# Patient Record
Sex: Male | Born: 1965 | Race: Black or African American | Hispanic: No | Marital: Married | Smoking: Never smoker
Health system: Southern US, Community
[De-identification: ages and names within clinical notes are randomized; demographics above are authoritative.]

## PROBLEM LIST (undated history)

## (undated) DIAGNOSIS — I10 Essential (primary) hypertension: Secondary | ICD-10-CM

---

## 2018-01-26 HISTORY — PX: SPINAL FUSION: SHX223

## 2020-01-15 ENCOUNTER — Emergency Department (HOSPITAL_BASED_OUTPATIENT_CLINIC_OR_DEPARTMENT_OTHER): Payer: BLUE CROSS/BLUE SHIELD

## 2020-01-15 ENCOUNTER — Other Ambulatory Visit: Payer: Self-pay

## 2020-01-15 ENCOUNTER — Encounter (HOSPITAL_BASED_OUTPATIENT_CLINIC_OR_DEPARTMENT_OTHER): Payer: Self-pay

## 2020-01-15 ENCOUNTER — Emergency Department (HOSPITAL_BASED_OUTPATIENT_CLINIC_OR_DEPARTMENT_OTHER)
Admission: EM | Admit: 2020-01-15 | Discharge: 2020-01-15 | Disposition: A | Discharge status: Home / Self Care | Payer: BLUE CROSS/BLUE SHIELD | Attending: Emergency Medicine | Admitting: Emergency Medicine

## 2020-01-15 DIAGNOSIS — Z20822 Contact with and (suspected) exposure to covid-19: Secondary | ICD-10-CM | POA: Diagnosis not present

## 2020-01-15 DIAGNOSIS — K529 Noninfective gastroenteritis and colitis, unspecified: Secondary | ICD-10-CM

## 2020-01-15 DIAGNOSIS — K4090 Unilateral inguinal hernia, without obstruction or gangrene, not specified as recurrent: Secondary | ICD-10-CM | POA: Insufficient documentation

## 2020-01-15 DIAGNOSIS — R1033 Periumbilical pain: Secondary | ICD-10-CM | POA: Diagnosis present

## 2020-01-15 DIAGNOSIS — I10 Essential (primary) hypertension: Secondary | ICD-10-CM | POA: Insufficient documentation

## 2020-01-15 HISTORY — DX: Essential (primary) hypertension: I10

## 2020-01-15 LAB — COMPREHENSIVE METABOLIC PANEL
ALT: 26 U/L (ref 0–44)
AST: 20 U/L (ref 15–41)
Albumin: 4.1 g/dL (ref 3.5–5.0)
Alkaline Phosphatase: 76 U/L (ref 38–126)
Anion gap: 9 (ref 5–15)
BUN: 14 mg/dL (ref 6–20)
CO2: 28 mmol/L (ref 22–32)
Calcium: 8.8 mg/dL — ABNORMAL LOW (ref 8.9–10.3)
Chloride: 100 mmol/L (ref 98–111)
Creatinine, Ser: 1.23 mg/dL (ref 0.61–1.24)
GFR, Estimated: >60 mL/min (ref >60)
Glucose, Bld: 89 mg/dL (ref 70–99)
Potassium: 3.6 mmol/L (ref 3.5–5.1)
Sodium: 137 mmol/L (ref 135–145)
Total Bilirubin: 0.8 mg/dL (ref 0.3–1.2)
Total Protein: 7.1 g/dL (ref 6.5–8.1)

## 2020-01-15 LAB — CBC
HCT: 46.5 % (ref 39.0–52.0)
Hemoglobin: 16.1 g/dL (ref 13.0–17.0)
MCH: 29.2 pg (ref 26.0–34.0)
MCHC: 34.6 g/dL (ref 30.0–36.0)
MCV: 84.2 fL (ref 80.0–100.0)
Platelets: 195 10*3/uL (ref 150–400)
RBC: 5.52 MIL/uL (ref 4.22–5.81)
RDW: 12.8 % (ref 11.5–15.5)
WBC: 4.5 10*3/uL (ref 4.0–10.5)
nRBC: 0 % (ref 0.0–0.2)

## 2020-01-15 LAB — URINALYSIS, ROUTINE W REFLEX MICROSCOPIC
Bilirubin Urine: NEGATIVE
Glucose, UA: NEGATIVE mg/dL
Hgb urine dipstick: NEGATIVE
Ketones, ur: 15 mg/dL — AB
Leukocytes,Ua: NEGATIVE
Nitrite: NEGATIVE
Protein, ur: NEGATIVE mg/dL
Specific Gravity, Urine: >1.03 — ABNORMAL HIGH (ref 1.005–1.030)
pH: 6 (ref 5.0–8.0)

## 2020-01-15 LAB — LIPASE, BLOOD: Lipase: 17 U/L (ref 11–51)

## 2020-01-15 LAB — RESP PANEL BY RT-PCR (FLU A&B, COVID) ARPGX2
Influenza A by PCR: NEGATIVE
Influenza B by PCR: NEGATIVE
SARS Coronavirus 2 by RT PCR: NEGATIVE

## 2020-01-15 MED ORDER — SODIUM CHLORIDE 0.9 % IV BOLUS
1000.0000 mL | Freq: Once | INTRAVENOUS | Status: AC
  Administered 2020-01-15: 13:00:00 1000 mL via INTRAVENOUS

## 2020-01-15 MED ORDER — ONDANSETRON 4 MG PO TBDP: 4.0000 mg | ORAL_TABLET | Freq: Three times a day (TID) | ORAL | 0 refills | Status: AC | PRN

## 2020-01-15 MED ORDER — ONDANSETRON HCL 4 MG/2ML IJ SOLN
4.0000 mg | Freq: Once | INTRAMUSCULAR | Status: AC
  Administered 2020-01-15: 13:00:00 4 mg via INTRAVENOUS
  Filled 2020-01-15: qty 2

## 2020-01-15 MED ORDER — IOHEXOL 300 MG/ML  SOLN
100.0000 mL | Freq: Once | INTRAMUSCULAR | Status: AC | PRN
  Administered 2020-01-15: 14:00:00 100 mL via INTRAVENOUS

## 2020-01-15 MED ORDER — DICYCLOMINE HCL 20 MG PO TABS: 20.0000 mg | ORAL_TABLET | Freq: Two times a day (BID) | ORAL | 0 refills | Status: AC

## 2020-01-15 NOTE — ED Notes (Signed)
[facility] w/o any complaints, gait remained very steady. Skin warm and dry

## 2020-01-15 NOTE — ED Notes (Signed)
States since last Friday has had umbilical pain, diarrhea, some nausea, PO intake is poor, states after eating pain increases.

## 2020-01-15 NOTE — Discharge Instructions (Signed)
Your CT scan showed findings consistent with inflammation in your small intestine likely from a viral GI bug. Please pick up medication and take as prescribed to help with nausea and abdominal cramping. Continue drinking plenty of fluids to stay hydrated.   Your CT scan also showed an incidental finding of a fat containing hernia in your right scrotum. Please follow up with [facility] to discuss elective repair.   Follow up with your PCP regarding your ED visit today and for recheck of your symptoms.   Return to the ED for any worsening symptoms.

## 2020-01-15 NOTE — ED Notes (Signed)
Pt states he is feeling much better already after IV Zofran and rec IV fluids

## 2020-01-15 NOTE — ED Triage Notes (Signed)
Friday started feeling nauseous and having abdominal cramping and dizziness. Saturday started having chills and night sweats & diarrhea. Complaining of mid abdominal pain, fatigue. Been vaccinated for covid & flu.

## 2020-01-15 NOTE — ED Provider Notes (Signed)
[facility] EMERGENCY DEPARTMENT Provider Note   CSN: [id number] Arrival date & time: 01/15/20  1043     History Chief Complaint  Patient presents with  . Diarrhea    Ilyas Lipsitz is a 54 y.o. male with PMHx HTN who presents to the ED today with complaint of gradual onset, constant, achy, periumbilical abdominal pain x 4 days. Pt also complains of nausea, lightheaded/dizziness, chills, and subjective fevers as well as watery diarrhea. He denies any recent sick contacts however works for [employer] and is never sure if he has been around anyone who is sick. He is vaccinated against COVID and flu. He has been drinking gingerale and taking his regular acid reflux medication without relief. Denies any suspicious food intake - eats at [facility] every morning and had an egg and sausage mcmuffin with 2 strawberry muffins prior to his symptoms beginning. No recent abx use. No other complaints at this time.   The history is provided by the patient and medical records.       Past Medical History:  Diagnosis Date  . Hypertension     There are no problems to display for this patient.   History reviewed. No pertinent surgical history.     History reviewed. No pertinent family history.  Social History   Tobacco Use  . Smoking status: Never Smoker  Substance Use Topics  . Alcohol use: Not Currently    Home Medications Prior to Admission medications   Medication Sig Start Date End Date Taking? Authorizing Provider  dicyclomine (BENTYL) 20 MG tablet Take 1 tablet (20 mg total) by mouth 2 (two) times daily. 01/15/20   Hyman Hopes, Donterrius Santucci, PA-C  ondansetron (ZOFRAN ODT) 4 MG disintegrating tablet Take 1 tablet (4 mg total) by mouth every 8 (eight) hours as needed for nausea or vomiting. 01/15/20   Tanda Rockers, PA-C    Allergies    Patient has no known allergies.  Review of Systems   Review of Systems  Constitutional: Positive for chills, fatigue and fever.  Respiratory:  Negative for cough and shortness of breath.   Cardiovascular: Negative for chest pain.  Gastrointestinal: Positive for abdominal pain, diarrhea and nausea. Negative for vomiting.  Neurological: Positive for dizziness and light-headedness.  All other systems reviewed and are negative.   Physical Exam Updated Vital Signs BP 120/80 (BP Location: Right Arm)   Pulse 76   Temp 98.4 F (36.9 C)   Resp 18   Ht 5' (1.524 m)   Wt 114.4 kg   SpO2 99%   BMI 49.27 kg/m   Physical Exam Vitals and nursing note reviewed.  Constitutional:      Appearance: He is obese. He is not ill-appearing or diaphoretic.  HENT:     Head: Normocephalic and atraumatic.  Eyes:     Conjunctiva/sclera: Conjunctivae normal.  Cardiovascular:     Rate and Rhythm: Normal rate and regular rhythm.     Pulses: Normal pulses.  Pulmonary:     Effort: Pulmonary effort is normal.     Breath sounds: No wheezing, rhonchi or rales.  Abdominal:     Palpations: Abdomen is soft.     Tenderness: There is abdominal tenderness in the epigastric area and periumbilical area. There is no right CVA tenderness, left CVA tenderness, guarding or rebound.  Musculoskeletal:     Cervical back: Neck supple.  Skin:    General: Skin is warm and dry.  Neurological:     Mental Status: He is alert.  ED Results / Procedures / Treatments   Labs (all labs ordered are listed, but only abnormal results are displayed) Labs Reviewed  COMPREHENSIVE METABOLIC PANEL - Abnormal; Notable for the following components:      Result Value   Calcium 8.8 (*)    All other components within normal limits  URINALYSIS, ROUTINE W REFLEX MICROSCOPIC - Abnormal; Notable for the following components:   Specific Gravity, Urine >1.030 (*)    Ketones, ur 15 (*)    All other components within normal limits  RESP PANEL BY RT-PCR (FLU A&B, COVID) ARPGX2  LIPASE, BLOOD  CBC    EKG None  Radiology CT Abdomen Pelvis W Contrast  Result Date:  01/15/2020 CLINICAL DATA:  Abdominal pain and nausea. EXAM: CT ABDOMEN AND PELVIS WITH CONTRAST TECHNIQUE: Multidetector CT imaging of the abdomen and pelvis was performed using the standard protocol following bolus administration of intravenous contrast. CONTRAST:  100mL OMNIPAQUE IOHEXOL 300 MG/ML  SOLN COMPARISON:  November 2020 FINDINGS: Lower chest: Lung bases are clear. Hepatobiliary: No focal liver lesions are appreciable. The gallbladder wall is not appreciably thickened. There is no biliary dilatation. Pancreas: No pancreatic mass or inflammatory focus. Spleen: No splenic lesions are evident. Adrenals/Urinary Tract: Adrenals bilaterally appear normal. Kidneys bilaterally show no evident mass or hydronephrosis on either side. There is no appreciable renal or ureteral calculus on either side. Urinary bladder is midline with urinary bladder wall thickness normal. Stomach/Bowel: There is wall thickening involving multiple loops of small bowel, primarily involving loops of distal jejunum in much of the ileum. Note that the terminal ileum appears normal. There is no appreciable colonic wall thickening. No evident gastric wall thickening. No evident bowel obstruction. No evident free air or portal venous air. Vascular/Lymphatic: There is no abdominal aortic aneurysm. No arterial vascular lesions are evident. Major venous structures appear patent. There is no evident adenopathy in the abdomen or pelvis. Reproductive: The prostate and seminal vesicles appear normal in size and contour. Other: No appendiceal region inflammation. Appendix rather diminutive. There is slight ascites in the lower right abdomen adjacent to loops of small bowel with mildly thickened walls. No appreciable ascites elsewhere. No abscess in the abdomen or pelvis. Fat containing inguinal hernia on the right extends to the level of the right scrotum. No bowel containing hernia evident. Musculoskeletal: Postoperative changes noted in the lower  lumbar region. There is spinal stenosis at L3-4 due to bony hypertrophy and disc protrusion. Similar changes noted to a lesser extent at L2-3. No blastic or lytic bone lesions are evident. There is arthropathy in the sacroiliac joints with partial fusion bilaterally in the sacroiliac joints. No erosion evident. No intramuscular lesions are evident. IMPRESSION: 1. Loops of dilated bowel involving distal jejunum in much of the ileum, likely due to a degree of enteritis. Terminal ileum appears normal. No fistula. No bowel obstruction. Appendix region appears unremarkable. No abscess in the abdomen pelvis. 2.  Trace ascites right lower abdomen. 3. Fat containing inguinal hernia extending into the right scrotum. No bowel containing hernia. 4. Spinal stenosis at L2-3 and L3-4 due to disc protrusion and bony hypertrophy. Postoperative changes noted in lower lumbar region. Electronically Signed   By: Bretta BangWilliam  Woodruff III M.D.   On: 01/15/2020 14:16    Procedures Procedures (including critical care time)  Medications Ordered in ED Medications  sodium chloride 0.9 % bolus 1,000 mL (0 mLs Intravenous Stopped 01/15/20 1447)  ondansetron (ZOFRAN) injection 4 mg (4 mg Intravenous Given 01/15/20 1314)  iohexol (  OMNIPAQUE) 300 MG/ML solution 100 mL (100 mLs Intravenous Contrast Given 01/15/20 1405)    ED Course  I have reviewed the triage vital signs and the nursing notes.  Pertinent labs & imaging results that were available during my care of the patient were reviewed by me and considered in my medical decision making (see chart for details).    MDM Rules/Calculators/A&P                          54 year old male who presents to the ED today with complaint of periumbilical abdominal pain, nausea, diarrhea for the past several days with a dizziness/lightheadedness feeling.  Denies any recent sick contacts.  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  Denies any recent issues food intake.  No antibiotic use to suggest C. Difficile.  Arrival to the ED patient is afebrile, nontachycardic nontachypneic.  He appears to be in no acute distress.  He has tenderness palpation along his periumbilical and epigastric area at this time.  No previous surgical history to abdomen.  He has no obvious right lower quadrant tenderness palpation however he is somewhat diffusely tender and is difficult to assess.  Given periumbilical abdominal pain will plan for CT scan at this time to assess for any appendicitis or other abnormality.  We will plan for fluids, orthostatic vital signs, Zofran for nausea.   Patient had lab work done while in the waiting room.   CBC without leukocytosis and hemoglobin stable at 16.1.  CMP without electrolyte abnormalities.  Creatinine 1.23, do not have baseline to compare to.  GFR greater than 60. Lipase 17. UA with increased spec gravity and ketones suggesting dehydration.  Fluids have been ordered. Covid and flu test negative.  CT scan with findings concerning for enteritis.  Given no leukocytosis I suspect this is more viral in nature.  It does appear she also has a fat-containing hernia in his right scrotum.  He states he has been having intermittent pain for the past 6 months most pacifically when he coughs in this area.  He is not having pain at this time and GU exam deferred.  Will provide information for general surgery follow up for hernia.   On reevaluation pt reports improvement in symptoms and is ready to go home. Will treat for viral gastroenteritis at this time with zofran and bentyl. Pt to follow up with PCP. Strict return precautions discussed. He is in agreement with plan and stable for discharge home.   This note was prepared using Dragon voice recognition software and may include unintentional dictation errors due to the inherent limitations of voice recognition software.  Final Clinical Impression(s) / ED  Diagnoses Final diagnoses:  Gastroenteritis  Scrotal hernia    Rx / DC Orders ED Discharge Orders         Ordered    ondansetron (ZOFRAN ODT) 4 MG disintegrating tablet  Every 8 hours PRN        01/15/20 1456    dicyclomine (BENTYL) 20 MG tablet  2 times daily        01/15/20 1456           Discharge Instructions     Your CT scan showed findings consistent with inflammation in your small intestine likely from a viral GI bug. Please pick up medication and take as prescribed to help with nausea and abdominal cramping. Continue drinking  plenty of fluids to stay hydrated.   Your CT scan also showed an incidental finding of a fat containing hernia in your right scrotum. Please follow up with [facility] to discuss elective repair.   Follow up with your PCP regarding your ED visit today and for recheck of your symptoms.   Return to the ED for any worsening symptoms.        Tanda Rockers, PA-C 01/15/20 1457    Terrilee Files, MD 01/15/20 [phone]

## 2020-01-15 NOTE — ED Notes (Signed)
ED Provider at bedside. 

## 2020-01-15 NOTE — ED Notes (Signed)
AVS and Rx x 2 reviewed with client, also provided information on MD referral per ED providers recommendations. Opportunity for questions provided, copy of AVS given to pt as well

## 2021-09-23 IMAGING — CT CT ABD-PELV W/ CM
2 of 5 series · 15 of 46 positions shown, 17 images · IV contrast (Omnipaque)
Comparison: November 2018

CLINICAL DATA: Abdominal pain and nausea.

EXAM:
CT ABDOMEN AND PELVIS WITH CONTRAST
TECHNIQUE: Multidetector CT imaging of the abdomen and pelvis was performed
using the standard protocol following bolus administration of
intravenous contrast.
CONTRAST:  100mL OMNIPAQUE IOHEXOL 300 MG/ML  SOLN

[Series 2: axial st · axial · 0.98mm/px · z∈[+496,+966]mm · 12 of 106 slices shown, 14 images]
[im 6/106  soft-tissue]
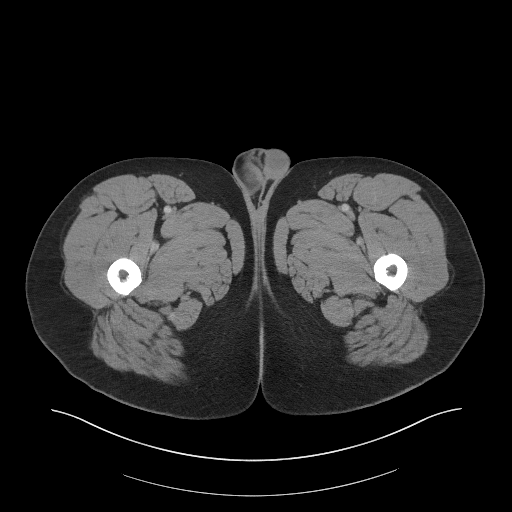
[im 6/106  bone]
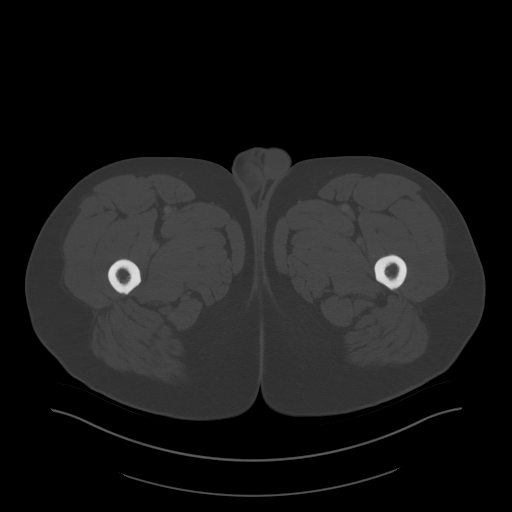
[im 17/106  soft-tissue]
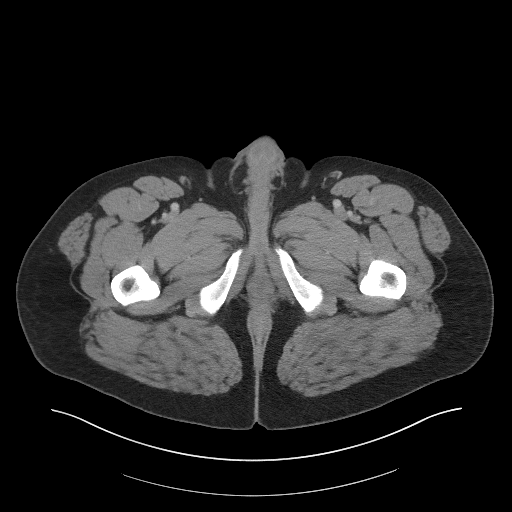
[im 23/106  soft-tissue]
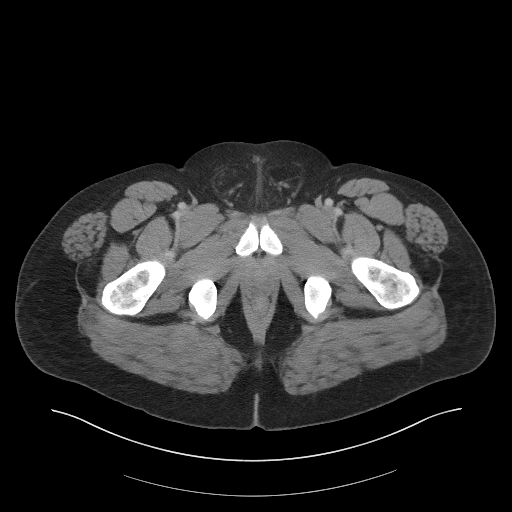
[im 34/106  soft-tissue]
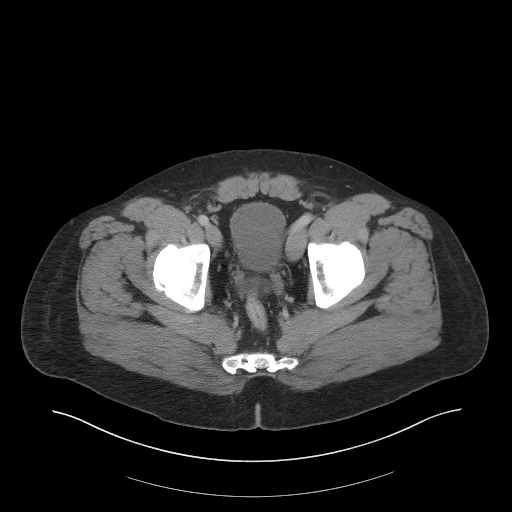
[im 39/106  soft-tissue]
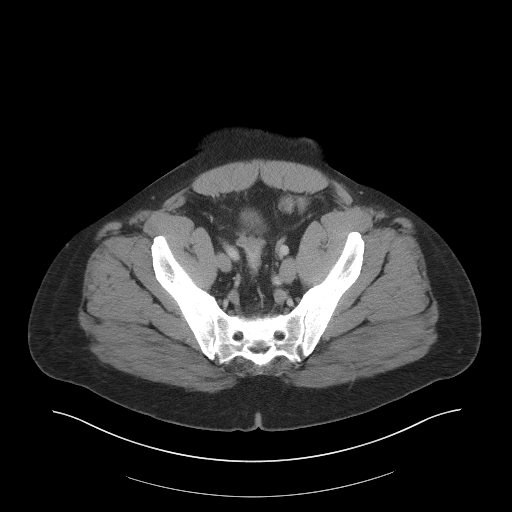
[im 50/106  soft-tissue]
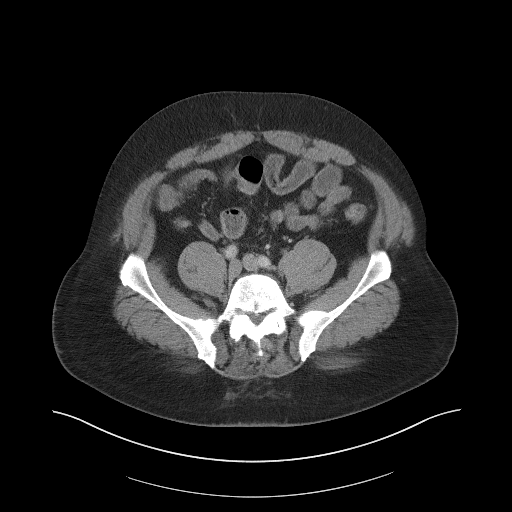
[im 56/106  soft-tissue]
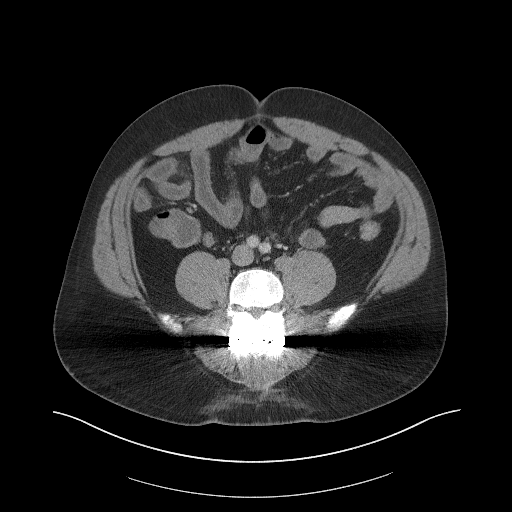
[im 67/106  soft-tissue]
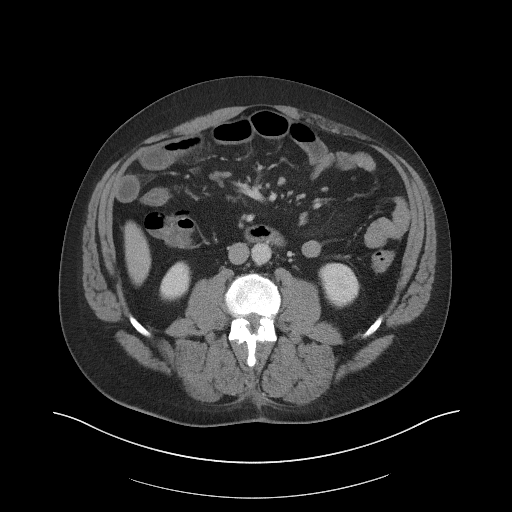
[im 72/106  soft-tissue]
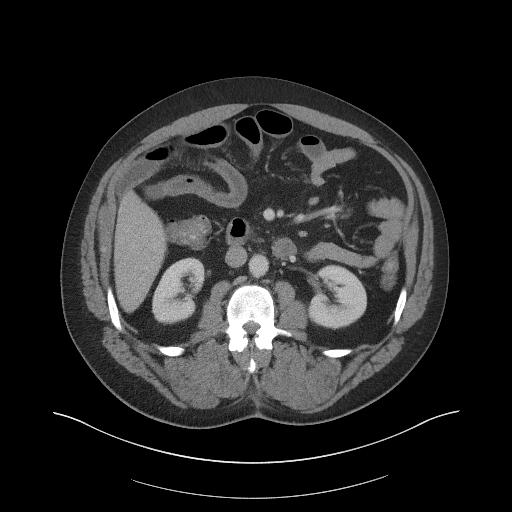
[im 72/106  bone]
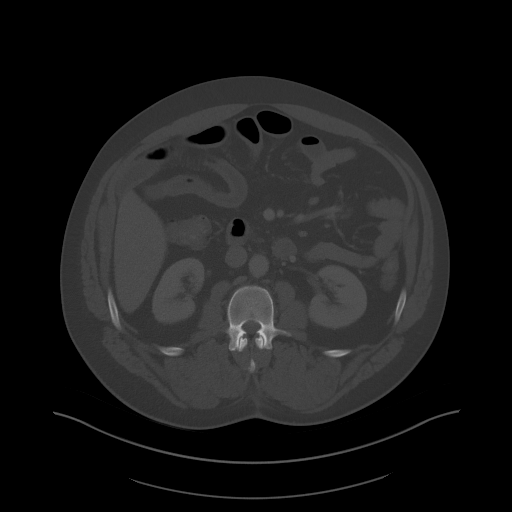
[im 83/106  soft-tissue]
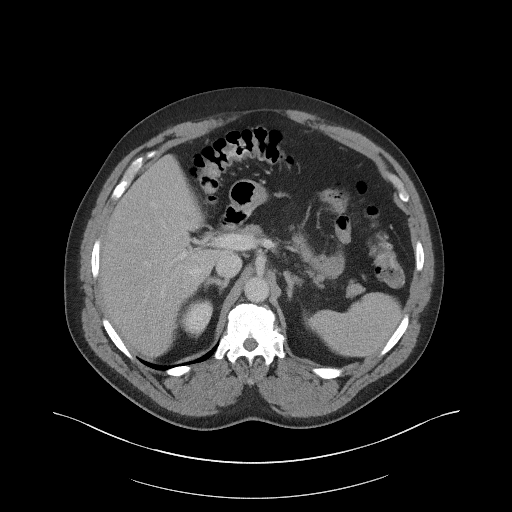
[im 89/106  soft-tissue]
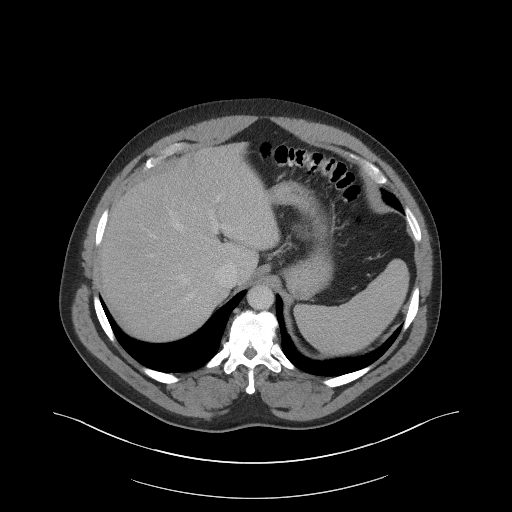
[im 100/106  soft-tissue]
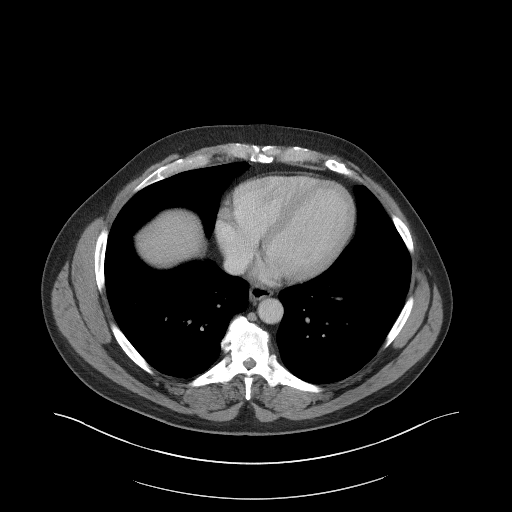

[Series 5: coronal st · coronal · 0.88mm/px · 3 of 118 slices shown]
[im 40/118  soft-tissue]
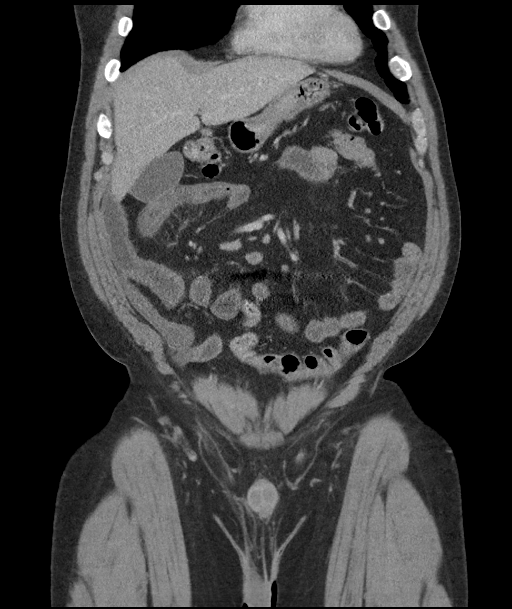
[im 53/118  soft-tissue]
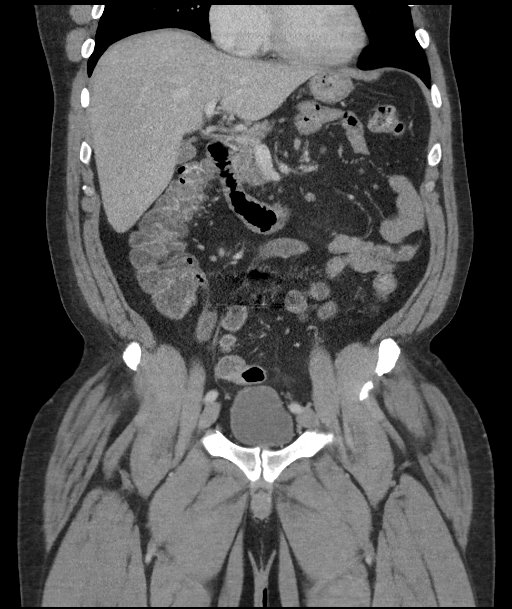
[im 66/118  soft-tissue]
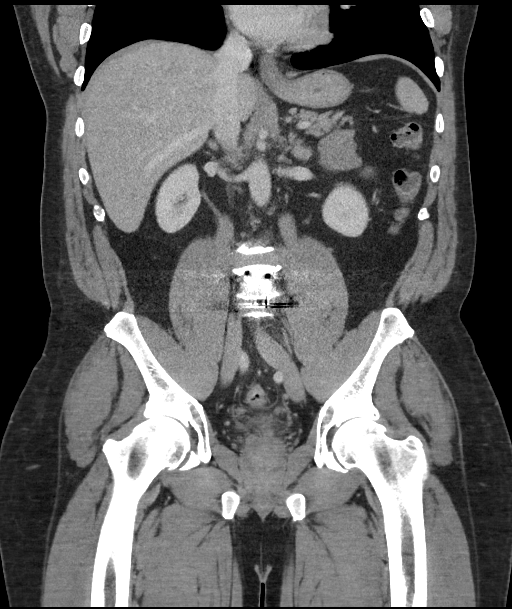

[15 of 46 positions shown; findings below may reference images not displayed]

FINDINGS: Lower chest: Lung bases are clear.

Hepatobiliary: No focal liver lesions are appreciable. The
gallbladder wall is not appreciably thickened. There is no biliary
dilatation.

Pancreas: No pancreatic mass or inflammatory focus.

Spleen: No splenic lesions are evident.

Adrenals/Urinary Tract: Adrenals bilaterally appear normal. Kidneys
bilaterally show no evident mass or hydronephrosis on either side.
There is no appreciable renal or ureteral calculus on either side.
Urinary bladder is midline with urinary bladder wall thickness
normal.

Stomach/Bowel: There is wall thickening involving multiple loops of
small bowel, primarily involving loops of distal jejunum in much of
the ileum. Note that the terminal ileum appears normal. There is no
appreciable colonic wall thickening. No evident gastric wall
thickening. No evident bowel obstruction. No evident free air or
portal venous air.

Vascular/Lymphatic: There is no abdominal aortic aneurysm. No
arterial vascular lesions are evident. Major venous structures
appear patent. There is no evident adenopathy in the abdomen or
pelvis.

Reproductive: The prostate and seminal vesicles appear normal in
size and contour.

Other: No appendiceal region inflammation. Appendix rather
diminutive. There is slight ascites in the lower right abdomen
adjacent to loops of small bowel with mildly thickened walls. No
appreciable ascites elsewhere. No abscess in the abdomen or pelvis.
Fat containing inguinal hernia on the right extends to the level of
the right scrotum. No bowel containing hernia evident.

Musculoskeletal: Postoperative changes noted in the lower lumbar
region. There is spinal stenosis at L3-4 due to bony hypertrophy and
disc protrusion. Similar changes noted to a lesser extent at L2-3.
No blastic or lytic bone lesions are evident. There is arthropathy
in the sacroiliac joints with partial fusion bilaterally in the
sacroiliac joints. No erosion evident. No intramuscular lesions are
evident.
IMPRESSION: 1. Loops of dilated bowel involving distal jejunum in much of the
ileum, likely due to a degree of enteritis. Terminal ileum appears
normal. No fistula. No bowel obstruction. Appendix region appears
unremarkable. No abscess in the abdomen pelvis.

2.  Trace ascites right lower abdomen.

3. Fat containing inguinal hernia extending into the right scrotum.
No bowel containing hernia.

4. Spinal stenosis at L2-3 and L3-4 due to disc protrusion and bony
hypertrophy. Postoperative changes noted in lower lumbar region.

## 2022-11-28 ENCOUNTER — Other Ambulatory Visit: Payer: Self-pay

## 2022-11-28 ENCOUNTER — Encounter (HOSPITAL_BASED_OUTPATIENT_CLINIC_OR_DEPARTMENT_OTHER): Payer: Self-pay | Admitting: Emergency Medicine

## 2022-11-28 ENCOUNTER — Emergency Department (HOSPITAL_BASED_OUTPATIENT_CLINIC_OR_DEPARTMENT_OTHER)
Admission: EM | Admit: 2022-11-28 | Discharge: 2022-11-28 | Disposition: A | Discharge status: Home / Self Care | Payer: 59 | Attending: Emergency Medicine | Admitting: Emergency Medicine

## 2022-11-28 DIAGNOSIS — Y92410 Unspecified street and highway as the place of occurrence of the external cause: Secondary | ICD-10-CM | POA: Insufficient documentation

## 2022-11-28 DIAGNOSIS — I10 Essential (primary) hypertension: Secondary | ICD-10-CM | POA: Diagnosis not present

## 2022-11-28 DIAGNOSIS — M542 Cervicalgia: Secondary | ICD-10-CM | POA: Diagnosis present

## 2022-11-28 DIAGNOSIS — S161XXA Strain of muscle, fascia and tendon at neck level, initial encounter: Secondary | ICD-10-CM | POA: Insufficient documentation

## 2022-11-28 MED ORDER — LIDOCAINE 5 % EX PTCH: 1.0000 | MEDICATED_PATCH | CUTANEOUS | 0 refills | Status: DC

## 2022-11-28 MED ORDER — LIDOCAINE 5 % EX PTCH
1.0000 | MEDICATED_PATCH | CUTANEOUS | Status: DC
  Administered 2022-11-28: 1 via TRANSDERMAL
  Filled 2022-11-28: qty 1

## 2022-11-28 MED ORDER — LIDOCAINE 5 % EX PTCH: 1.0000 | MEDICATED_PATCH | CUTANEOUS | 0 refills | Status: AC

## 2022-11-28 MED ORDER — CYCLOBENZAPRINE HCL 10 MG PO TABS: 10.0000 mg | ORAL_TABLET | Freq: Two times a day (BID) | ORAL | 0 refills | Status: AC | PRN

## 2022-11-28 MED ORDER — CYCLOBENZAPRINE HCL 10 MG PO TABS: 10.0000 mg | ORAL_TABLET | Freq: Two times a day (BID) | ORAL | 0 refills | Status: DC | PRN

## 2022-11-28 NOTE — ED Notes (Signed)
ED Provider at bedside. 
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
